# Patient Record
Sex: Female | Born: 1945 | Race: White | Hispanic: No | Marital: Married | Smoking: Never smoker
Health system: Southern US, Community
[De-identification: ages and names within clinical notes are randomized; demographics above are authoritative.]

## PROBLEM LIST (undated history)

## (undated) DIAGNOSIS — G709 Myoneural disorder, unspecified: Secondary | ICD-10-CM

## (undated) DIAGNOSIS — K219 Gastro-esophageal reflux disease without esophagitis: Secondary | ICD-10-CM

## (undated) HISTORY — PX: ABDOMINAL HYSTERECTOMY: SHX81

## (undated) HISTORY — PX: OTHER SURGICAL HISTORY: SHX169

## (undated) HISTORY — PX: TRIGGER FINGER RELEASE: SHX641

## (undated) HISTORY — PX: CHOLECYSTECTOMY: SHX55

---

## 2018-02-27 ENCOUNTER — Other Ambulatory Visit: Payer: Self-pay | Admitting: Orthopaedic Surgery

## 2018-02-27 DIAGNOSIS — M25571 Pain in right ankle and joints of right foot: Secondary | ICD-10-CM

## 2018-02-27 DIAGNOSIS — M25572 Pain in left ankle and joints of left foot: Secondary | ICD-10-CM

## 2018-02-28 ENCOUNTER — Ambulatory Visit
Admission: RE | Admit: 2018-02-28 | Discharge: 2018-02-28 | Disposition: A | Discharge status: Home / Self Care | Payer: Medicare Other | Source: Ambulatory Visit | Attending: Orthopaedic Surgery | Admitting: Orthopaedic Surgery

## 2018-02-28 DIAGNOSIS — M25572 Pain in left ankle and joints of left foot: Secondary | ICD-10-CM

## 2018-02-28 DIAGNOSIS — M25571 Pain in right ankle and joints of right foot: Secondary | ICD-10-CM

## 2018-03-14 ENCOUNTER — Other Ambulatory Visit: Payer: Self-pay | Admitting: Orthopaedic Surgery

## 2018-03-27 ENCOUNTER — Encounter (HOSPITAL_COMMUNITY): Payer: Self-pay

## 2018-03-27 ENCOUNTER — Other Ambulatory Visit: Payer: Self-pay

## 2018-03-27 ENCOUNTER — Encounter (HOSPITAL_COMMUNITY)
Admission: RE | Admit: 2018-03-27 | Discharge: 2018-03-27 | Disposition: A | Discharge status: Home / Self Care | Payer: Medicare Other | Source: Ambulatory Visit | Attending: Orthopaedic Surgery | Admitting: Orthopaedic Surgery

## 2018-03-27 DIAGNOSIS — K219 Gastro-esophageal reflux disease without esophagitis: Secondary | ICD-10-CM | POA: Diagnosis not present

## 2018-03-27 DIAGNOSIS — M722 Plantar fascial fibromatosis: Secondary | ICD-10-CM | POA: Insufficient documentation

## 2018-03-27 DIAGNOSIS — Z79899 Other long term (current) drug therapy: Secondary | ICD-10-CM | POA: Diagnosis not present

## 2018-03-27 DIAGNOSIS — Z791 Long term (current) use of non-steroidal anti-inflammatories (NSAID): Secondary | ICD-10-CM

## 2018-03-27 DIAGNOSIS — M7672 Peroneal tendinitis, left leg: Secondary | ICD-10-CM | POA: Diagnosis not present

## 2018-03-27 DIAGNOSIS — Z7982 Long term (current) use of aspirin: Secondary | ICD-10-CM | POA: Insufficient documentation

## 2018-03-27 DIAGNOSIS — Z79890 Hormone replacement therapy (postmenopausal): Secondary | ICD-10-CM | POA: Diagnosis not present

## 2018-03-27 DIAGNOSIS — Z01812 Encounter for preprocedural laboratory examination: Secondary | ICD-10-CM

## 2018-03-27 DIAGNOSIS — Z9049 Acquired absence of other specified parts of digestive tract: Secondary | ICD-10-CM

## 2018-03-27 DIAGNOSIS — G5751 Tarsal tunnel syndrome, right lower limb: Secondary | ICD-10-CM | POA: Diagnosis not present

## 2018-03-27 HISTORY — DX: Myoneural disorder, unspecified: G70.9

## 2018-03-27 HISTORY — DX: Gastro-esophageal reflux disease without esophagitis: K21.9

## 2018-03-27 LAB — CBC
HCT: 40.5 % (ref 36.0–46.0)
Hemoglobin: 12.9 g/dL (ref 12.0–15.0)
MCH: 29.5 pg (ref 26.0–34.0)
MCHC: 31.9 g/dL (ref 30.0–36.0)
MCV: 92.5 fL (ref 80.0–100.0)
Platelets: 209 10*3/uL (ref 150–400)
RBC: 4.38 MIL/uL (ref 3.87–5.11)
RDW: 14.1 % (ref 11.5–15.5)
WBC: 6 10*3/uL (ref 4.0–10.5)
nRBC: 0 % (ref 0.0–0.2)

## 2018-03-27 LAB — BASIC METABOLIC PANEL
Anion gap: 9 (ref 5–15)
BUN: 20 mg/dL (ref 8–23)
CO2: 23 mmol/L (ref 22–32)
Calcium: 9.1 mg/dL (ref 8.9–10.3)
Chloride: 106 mmol/L (ref 98–111)
Creatinine, Ser: 0.97 mg/dL (ref 0.44–1.00)
GFR calc non Af Amer: 58 mL/min — ABNORMAL LOW (ref >60)
Glucose, Bld: 101 mg/dL — ABNORMAL HIGH (ref 70–99)
Potassium: 4 mmol/L (ref 3.5–5.1)
Sodium: 138 mmol/L (ref 135–145)

## 2018-03-27 NOTE — Anesthesia Preprocedure Evaluation (Addendum)
Anesthesia Evaluation  Patient identified by MRN, date of birth, ID band  Reviewed: Allergy & Precautions, NPO status , Patient's Chart, lab work & pertinent test results  History of Anesthesia Complications Negative for: history of anesthetic complications  Airway Mallampati: II  TM Distance: >3 FB Neck ROM: Full    Dental  (+) Teeth Intact, Dental Advisory Given   Pulmonary neg pulmonary ROS,    Pulmonary exam normal breath sounds clear to auscultation       Cardiovascular negative cardio ROS Normal cardiovascular exam Rhythm:Regular Rate:Normal  Negative stress echo 07/2017   Neuro/Psych negative neurological ROS     GI/Hepatic Neg liver ROS, hiatal hernia, GERD  Medicated and Poorly Controlled,  Endo/Other  negative endocrine ROS  Renal/GU negative Renal ROS     Musculoskeletal Right plantar fascitis   Abdominal   Peds  Hematology negative hematology ROS (+)   Anesthesia Other Findings Day of surgery medications reviewed with the patient.  Reproductive/Obstetrics                           Anesthesia Physical Anesthesia Plan  ASA: II  Anesthesia Plan: General   Post-op Pain Management: GA combined w/ Regional for post-op pain   Induction: Intravenous, Rapid sequence and Cricoid pressure planned  PONV Risk Score and Plan: 3 and Treatment may vary due to age or medical condition and Ondansetron  Airway Management Planned: Oral ETT  Additional Equipment:   Intra-op Plan:   Post-operative Plan: Extubation in OR  Informed Consent: I have reviewed the patients History and Physical, chart, labs and discussed the procedure including the risks, benefits and alternatives for the proposed anesthesia with the patient or authorized representative who has indicated his/her understanding and acceptance.     Dental advisory given  Plan Discussed with: CRNA  Anesthesia Plan Comments: (PAT  note written 03/27/2018 by Shonna Chock, PA-C. )     Anesthesia Quick Evaluation

## 2018-03-27 NOTE — Progress Notes (Addendum)
PCP - Evelena Peat, MD Cardiologist - Dr. Rayfield Citizen   Chest x-ray - pt denies past year, no recent respiratory infections EKG - 05/14/17- Care Everywhere-requested tracing  Stress Test -  07/10/17 in Care Everywhere ECHO -  07/10/17 in Care Everywhere  Cardiac Cath - pt denies  Sleep Study - pt denies CPAP - n./a  Fasting Blood Sugar - n/a Checks Blood Sugar _____ times a day-n./a  Blood Thinner Instructions: n/a Aspirin Instructions: Aspirin 81 mg on hold for 4 weeks   Anesthesia review: Yes-cardiac testing in Care Everywhere, requested EKG tracing  Patient denies shortness of breath, fever, cough and chest pain at PAT appointment  Patient verbalized understanding of instructions that were given to them at the PAT appointment. Patient was also instructed that they will need to review over the PAT instructions again at home before surgery.

## 2018-03-27 NOTE — Pre-Procedure Instructions (Addendum)
Myeisha Akens  03/27/2018      [id number] - [place], [place] - [address] AT [facility] [address] [place] [address] Phone: [phone] Fax: [phone]    Your procedure is scheduled on March 28, 2018.  Report to [facility] Admitting at 0900 AM.  Call this number if you have problems the morning of surgery:  [phone]   Remember:  Do not eat or drink after midnight, tonight 03/27/2018    Take these medicines the morning of surgery with A SIP OF WATER  Dexilant Famotidine (pepcid) Tylenol-if needed  Follow your surgeon's instructions on when to hold/resume aspirin.  If no instructions were given call the office to determine how they would like to you take aspirin  Beginning now, STOP taking any Aleve, Naproxen, Ibuprofen, Motrin, Advil, Goody's, BC's, all herbal medications, fish oil, and all vitamins    Do not wear jewelry, make-up or nail polish.  Do not wear lotions, powders, or perfumes, or deodorant.  Do not shave 48 hours prior to surgery.    Do not bring valuables to the hospital.  [facility] is not responsible for any belongings or valuables.  Contacts, dentures or bridgework may not be worn into surgery.  Leave your suitcase in the car.  After surgery it may be brought to your room.  For patients admitted to the hospital, discharge time will be determined by your treatment team.  Patients discharged the day of surgery will not be allowed to drive home.    [place]- Preparing For Surgery  Before surgery, you can play an important role. Because skin is not sterile, your skin needs to be as free of germs as possible. You can reduce the number of germs on your skin by washing with CHG (chlorahexidine gluconate) Soap before surgery.  CHG is an antiseptic cleaner which kills germs and bonds with the skin to continue killing germs even after washing.    Oral Hygiene is also important to reduce  your risk of infection.  Remember - BRUSH YOUR TEETH THE MORNING OF SURGERY WITH YOUR REGULAR TOOTHPASTE  Please do not use if you have an allergy to CHG or antibacterial soaps. If your skin becomes reddened/irritated stop using the CHG.  Do not shave (including legs and underarms) for at least 48 hours prior to first CHG shower. It is OK to shave your face.  Please follow these instructions carefully.   1. Shower the NIGHT BEFORE SURGERY and the MORNING OF SURGERY with CHG.   2. If you chose to wash your hair, wash your hair first as usual with your normal shampoo.  3. After you shampoo, rinse your hair and body thoroughly to remove the shampoo.  4. Use CHG as you would any other liquid soap. You can apply CHG directly to the skin and wash gently with a scrungie or a clean washcloth.   5. Apply the CHG Soap to your body ONLY FROM THE NECK DOWN.  Do not use on open wounds or open sores. Avoid contact with your eyes, ears, mouth and genitals (private parts). Wash Face and genitals (private parts)  with your normal soap.  6. Wash thoroughly, paying special attention to the area where your surgery will be performed.  7. Thoroughly rinse your body with warm water from the neck down.  8. DO NOT shower/wash with your normal soap after using and rinsing off the CHG  Soap.  9. Pat yourself dry with a CLEAN TOWEL.  10. Wear CLEAN PAJAMAS to bed the night before surgery, wear comfortable clothes the morning of surgery  11. Place CLEAN SHEETS on your bed the night of your first shower and DO NOT SLEEP WITH PETS.    Day of Surgery:  Do not apply any deodorants/lotions.  Please wear clean clothes to the hospital/surgery center.   Remember to brush your teeth WITH YOUR REGULAR TOOTHPASTE.   Please read over the following fact sheets that you were given.

## 2018-03-27 NOTE — Progress Notes (Signed)
Anesthesia Chart Review:  Case:  [id number] Date/Time:  03/28/18 1046   Procedure:  RIGHT PLANTAR FASCIA RELEASE AND TARSAL TUNNEL RELEASE (Right )   Anesthesia type:  Choice   Pre-op diagnosis:  RIGHT FOOT CHRONIC PLANTAR FASCIITIS   Location:  [facility] ROOM 06 / [facility] OR   Surgeon:  Terance Hart, MD      DISCUSSION: Patient is a 73 year old female scheduled for the above procedure.  History includes never smoker, GERD, hiatal hernia, HLD. She had elevated LFTs 09/06/17 with normal abdominal US, s/p ERCP 09/13/08 with repeat LFTs 12/11/17 WNL. Negative stress echo 07/2017.   If no acute changes then I anticipate that she can proceed as planned.   VS: BP 128/64   Pulse 77   Temp (!) 36.3 C   Resp 20   Ht 5' (1.524 m)   Wt 68.6 kg   SpO2 97%   BMI 29.53 kg/m   PROVIDERS: Yolanda Manges, DO is PCP [facility], [place]; see Care Everywhere) - She was seen by cardiologist Holley Raring, MD on 05/14/17 for atypical chest pain, likely GERD or hiatal hernia related. EKG unremarkable. Exercise stress echo was non-ischemic in 07/2017, so PRN follow-up recommended.     LABS: Labs reviewed: Acceptable for surgery. (all labs ordered are listed, but only abnormal results are displayed)  Labs Reviewed  BASIC METABOLIC PANEL - Abnormal; Notable for the following components:      Result Value   Glucose, Bld 101 (*)    GFR calc non Af Amer 58 (*)    All other components within normal limits  CBC    EKG: 05/14/17 [facility]): Tracing requested: Per Result Narrative: Ventricular Rate          75    BPM          Atrial Rate            75    BPM          P-R Interval            178    ms          QRS Duration            80    ms          Q-T Interval            376    ms          QTC                419    ms          P Axis                64    degrees        R Axis               71    degrees        T Axis               43    degrees        Sinus rhythm normal Confirmed by CHEEK, H. BARRETT ([id number]) on 05/14/2017 12:43:03 PM    CV: Exercise stress echo 07/10/17 [facility] Care Everywhere): Summary Functional capacity is fair for age/sex - 7 mets on the 2-min Bruce protocol Normal resting biventricular function (ejection fraction), with no resting segmental abnormality. No clinical or echocardiographic ischemia (induced wall motion abnormality): Negative  stress echocardiogram  Past Medical History:  Diagnosis Date  . GERD (gastroesophageal reflux disease)   . Neuromuscular disorder [facility])     Past Surgical History:  Procedure Laterality Date  . ABDOMINAL HYSTERECTOMY    . CHOLECYSTECTOMY    . cyst excision     left foot 2018  . TRIGGER FINGER RELEASE Right     MEDICATIONS: . acetaminophen (TYLENOL) 500 MG tablet  . Ascorbic Acid (VITAMIN C) 1000 MG tablet  . aspirin EC 81 MG tablet  . BIOTIN PO  . DEXILANT 60 MG capsule  . estradiol (CLIMARA - DOSED IN MG/24 HR) 0.075 mg/24hr patch  . famotidine (PEPCID) 40 MG tablet  . ibuprofen (ADVIL,MOTRIN) 200 MG tablet  . Probiotic Product (PROBIOTIC DAILY PO)   No current facility-administered medications for this encounter.   She hasn't taken ASA in 4 weeks.   Shonna Chock, PA-C Surgical Short Stay/Anesthesiology [facility] Phone [phone] [facility] Phone [phone] 03/27/2018 4:14 PM

## 2018-03-28 ENCOUNTER — Ambulatory Visit (HOSPITAL_COMMUNITY)
Admission: RE | Admit: 2018-03-28 | Discharge: 2018-03-28 | Disposition: A | Discharge status: Home / Self Care | Payer: Medicare Other | Attending: Orthopaedic Surgery | Admitting: Orthopaedic Surgery

## 2018-03-28 ENCOUNTER — Encounter (HOSPITAL_COMMUNITY): Payer: Self-pay | Admitting: General Practice

## 2018-03-28 ENCOUNTER — Encounter (HOSPITAL_COMMUNITY)
Admission: RE | Disposition: A | Discharge status: Home / Self Care | Payer: Self-pay | Source: Home / Self Care | Attending: Orthopaedic Surgery

## 2018-03-28 ENCOUNTER — Ambulatory Visit (HOSPITAL_COMMUNITY): Payer: Medicare Other | Admitting: Anesthesiology

## 2018-03-28 ENCOUNTER — Ambulatory Visit (HOSPITAL_COMMUNITY): Payer: Medicare Other | Admitting: Vascular Surgery

## 2018-03-28 DIAGNOSIS — M722 Plantar fascial fibromatosis: Secondary | ICD-10-CM | POA: Insufficient documentation

## 2018-03-28 DIAGNOSIS — K219 Gastro-esophageal reflux disease without esophagitis: Secondary | ICD-10-CM | POA: Insufficient documentation

## 2018-03-28 DIAGNOSIS — Z79899 Other long term (current) drug therapy: Secondary | ICD-10-CM | POA: Insufficient documentation

## 2018-03-28 DIAGNOSIS — M7672 Peroneal tendinitis, left leg: Secondary | ICD-10-CM | POA: Diagnosis not present

## 2018-03-28 DIAGNOSIS — G5751 Tarsal tunnel syndrome, right lower limb: Secondary | ICD-10-CM | POA: Diagnosis not present

## 2018-03-28 DIAGNOSIS — Z79890 Hormone replacement therapy (postmenopausal): Secondary | ICD-10-CM | POA: Insufficient documentation

## 2018-03-28 DIAGNOSIS — Z7982 Long term (current) use of aspirin: Secondary | ICD-10-CM | POA: Insufficient documentation

## 2018-03-28 HISTORY — PX: PLANTAR FASCIA RELEASE: SHX2239

## 2018-03-28 HISTORY — PX: STERIOD INJECTION: SHX5046

## 2018-03-28 SURGERY — RELEASE, FASCIA, PLANTAR
Anesthesia: General | Site: Foot | Laterality: Right

## 2018-03-28 MED ORDER — PHENYLEPHRINE 40 MCG/ML (10ML) SYRINGE FOR IV PUSH (FOR BLOOD PRESSURE SUPPORT)
PREFILLED_SYRINGE | INTRAVENOUS | Status: AC
  Filled 2018-03-28: qty 10

## 2018-03-28 MED ORDER — PROPOFOL 10 MG/ML IV BOLUS
INTRAVENOUS | Status: DC | PRN
  Administered 2018-03-28: 120 mg via INTRAVENOUS

## 2018-03-28 MED ORDER — MIDAZOLAM HCL 2 MG/2ML IJ SOLN
INTRAMUSCULAR | Status: AC
  Administered 2018-03-28: 1 mg
  Filled 2018-03-28: qty 2

## 2018-03-28 MED ORDER — MIDAZOLAM HCL 2 MG/2ML IJ SOLN
INTRAMUSCULAR | Status: AC
  Filled 2018-03-28: qty 2

## 2018-03-28 MED ORDER — PROPOFOL 10 MG/ML IV BOLUS
INTRAVENOUS | Status: AC
  Filled 2018-03-28: qty 20

## 2018-03-28 MED ORDER — MIDAZOLAM HCL 5 MG/5ML IJ SOLN
INTRAMUSCULAR | Status: DC | PRN
  Administered 2018-03-28: 2 mg via INTRAVENOUS

## 2018-03-28 MED ORDER — PROMETHAZINE HCL 6.25 MG/5ML PO SYRP: 6.2500 mg | ORAL_SOLUTION | Freq: Four times a day (QID) | ORAL | 1 refills | Status: AC | PRN

## 2018-03-28 MED ORDER — ONDANSETRON HCL 4 MG/2ML IJ SOLN
INTRAMUSCULAR | Status: DC | PRN
  Administered 2018-03-28: 4 mg via INTRAVENOUS

## 2018-03-28 MED ORDER — 0.9 % SODIUM CHLORIDE (POUR BTL) OPTIME
TOPICAL | Status: DC | PRN
  Administered 2018-03-28: 1000 mL

## 2018-03-28 MED ORDER — CHLORHEXIDINE GLUCONATE 4 % EX LIQD: 60.0000 mL | Freq: Once | CUTANEOUS | Status: DC

## 2018-03-28 MED ORDER — BUPIVACAINE-EPINEPHRINE (PF) 0.5% -1:200000 IJ SOLN
INTRAMUSCULAR | Status: DC | PRN
  Administered 2018-03-28: 15 mL via PERINEURAL
  Administered 2018-03-28: 10 mL via PERINEURAL

## 2018-03-28 MED ORDER — FENTANYL CITRATE (PF) 100 MCG/2ML IJ SOLN
25.0000 ug | INTRAMUSCULAR | Status: DC | PRN
  Administered 2018-03-28 (×2): 50 ug via INTRAVENOUS
  Administered 2018-03-28 (×2): 25 ug via INTRAVENOUS

## 2018-03-28 MED ORDER — MORPHINE SULFATE 15 MG PO TABS: 15.0000 mg | ORAL_TABLET | Freq: Four times a day (QID) | ORAL | 0 refills | Status: AC | PRN

## 2018-03-28 MED ORDER — FENTANYL CITRATE (PF) 250 MCG/5ML IJ SOLN
INTRAMUSCULAR | Status: AC
  Filled 2018-03-28: qty 5

## 2018-03-28 MED ORDER — SUCCINYLCHOLINE CHLORIDE 200 MG/10ML IV SOSY
PREFILLED_SYRINGE | INTRAVENOUS | Status: DC | PRN
  Administered 2018-03-28: 80 mg via INTRAVENOUS

## 2018-03-28 MED ORDER — FENTANYL CITRATE (PF) 100 MCG/2ML IJ SOLN
INTRAMUSCULAR | Status: AC
  Administered 2018-03-28: 50 ug
  Filled 2018-03-28: qty 2

## 2018-03-28 MED ORDER — LACTATED RINGERS IV SOLN: INTRAVENOUS | Status: DC

## 2018-03-28 MED ORDER — ACETAMINOPHEN 500 MG PO TABS
1000.0000 mg | ORAL_TABLET | Freq: Once | ORAL | Status: AC
  Administered 2018-03-28: 1000 mg via ORAL

## 2018-03-28 MED ORDER — METHYLPREDNISOLONE ACETATE 80 MG/ML IJ SUSP
INTRAMUSCULAR | Status: DC | PRN
  Administered 2018-03-28: 80 mg via INTRALESIONAL

## 2018-03-28 MED ORDER — FENTANYL CITRATE (PF) 100 MCG/2ML IJ SOLN
INTRAMUSCULAR | Status: AC
  Administered 2018-03-28: 25 ug via INTRAVENOUS
  Filled 2018-03-28: qty 2

## 2018-03-28 MED ORDER — ONDANSETRON HCL 4 MG/2ML IJ SOLN
INTRAMUSCULAR | Status: AC
  Filled 2018-03-28: qty 2

## 2018-03-28 MED ORDER — LIDOCAINE 2% (20 MG/ML) 5 ML SYRINGE
INTRAMUSCULAR | Status: AC
  Filled 2018-03-28: qty 5

## 2018-03-28 MED ORDER — EPHEDRINE SULFATE-NACL 50-0.9 MG/10ML-% IV SOSY
PREFILLED_SYRINGE | INTRAVENOUS | Status: DC | PRN
  Administered 2018-03-28 (×2): 5 mg via INTRAVENOUS

## 2018-03-28 MED ORDER — LIDOCAINE 2% (20 MG/ML) 5 ML SYRINGE
INTRAMUSCULAR | Status: DC | PRN
  Administered 2018-03-28: 40 mg via INTRAVENOUS

## 2018-03-28 MED ORDER — ROCURONIUM BROMIDE 50 MG/5ML IV SOSY
PREFILLED_SYRINGE | INTRAVENOUS | Status: AC
  Filled 2018-03-28: qty 5

## 2018-03-28 MED ORDER — PROMETHAZINE HCL 25 MG/ML IJ SOLN: 6.2500 mg | INTRAMUSCULAR | Status: DC | PRN

## 2018-03-28 MED ORDER — ACETAMINOPHEN 10 MG/ML IV SOLN: 1000.0000 mg | Freq: Once | INTRAVENOUS | Status: DC | PRN

## 2018-03-28 MED ORDER — FENTANYL CITRATE (PF) 100 MCG/2ML IJ SOLN
INTRAMUSCULAR | Status: DC | PRN
  Administered 2018-03-28: 75 ug via INTRAVENOUS
  Administered 2018-03-28: 25 ug via INTRAVENOUS
  Administered 2018-03-28: 50 ug via INTRAVENOUS

## 2018-03-28 MED ORDER — EPHEDRINE 5 MG/ML INJ
INTRAVENOUS | Status: AC
  Filled 2018-03-28: qty 10

## 2018-03-28 MED ORDER — ACETAMINOPHEN 500 MG PO TABS
ORAL_TABLET | ORAL | Status: AC
  Administered 2018-03-28: 1000 mg via ORAL
  Filled 2018-03-28: qty 2

## 2018-03-28 MED ORDER — BUPIVACAINE HCL 0.5 % IJ SOLN
INTRAMUSCULAR | Status: DC | PRN
  Administered 2018-03-28: 10 mL

## 2018-03-28 MED ORDER — METHYLPREDNISOLONE ACETATE 80 MG/ML IJ SUSP
INTRAMUSCULAR | Status: AC
  Filled 2018-03-28: qty 1

## 2018-03-28 MED ORDER — CEFAZOLIN SODIUM-DEXTROSE 2-4 GM/100ML-% IV SOLN
2.0000 g | INTRAVENOUS | Status: AC
  Administered 2018-03-28: 2 g via INTRAVENOUS

## 2018-03-28 MED ORDER — BUPIVACAINE HCL (PF) 0.5 % IJ SOLN
INTRAMUSCULAR | Status: AC
  Filled 2018-03-28: qty 10

## 2018-03-28 MED ORDER — FENTANYL CITRATE (PF) 100 MCG/2ML IJ SOLN
INTRAMUSCULAR | Status: AC
  Administered 2018-03-28: 50 ug via INTRAVENOUS
  Filled 2018-03-28: qty 2

## 2018-03-28 MED ORDER — LACTATED RINGERS IV SOLN
INTRAVENOUS | Status: DC | PRN
  Administered 2018-03-28: 10:00:00 via INTRAVENOUS

## 2018-03-28 MED ORDER — CEFAZOLIN SODIUM-DEXTROSE 2-4 GM/100ML-% IV SOLN
INTRAVENOUS | Status: AC
  Filled 2018-03-28: qty 100

## 2018-03-28 SURGICAL SUPPLY — 50 items
BANDAGE ACE 4X5 VEL STRL LF (GAUZE/BANDAGES/DRESSINGS) ×4 IMPLANT
BANDAGE ACE 6X5 VEL STRL LF (GAUZE/BANDAGES/DRESSINGS) ×4 IMPLANT
BANDAGE ESMARK 6X9 LF (GAUZE/BANDAGES/DRESSINGS) IMPLANT
BNDG COHESIVE 4X5 TAN STRL (GAUZE/BANDAGES/DRESSINGS) IMPLANT
BNDG ELASTIC 6X10 VLCR STRL LF (GAUZE/BANDAGES/DRESSINGS) ×4 IMPLANT
BNDG ESMARK 4X9 LF (GAUZE/BANDAGES/DRESSINGS) IMPLANT
BNDG ESMARK 6X9 LF (GAUZE/BANDAGES/DRESSINGS)
BNDG GAUZE ELAST 4 BULKY (GAUZE/BANDAGES/DRESSINGS) ×4 IMPLANT
COVER SURGICAL LIGHT HANDLE (MISCELLANEOUS) ×8 IMPLANT
COVER WAND RF STERILE (DRAPES) IMPLANT
CUFF TOURNIQUET SINGLE 34IN LL (TOURNIQUET CUFF) ×4 IMPLANT
DRAPE U-SHAPE 47X51 STRL (DRAPES) ×4 IMPLANT
DRSG PAD ABDOMINAL 8X10 ST (GAUZE/BANDAGES/DRESSINGS) IMPLANT
DRSG XEROFORM 1X8 (GAUZE/BANDAGES/DRESSINGS) IMPLANT
DURAPREP 26ML APPLICATOR (WOUND CARE) ×4 IMPLANT
ELECT REM PT RETURN 9FT ADLT (ELECTROSURGICAL) ×4
ELECTRODE REM PT RTRN 9FT ADLT (ELECTROSURGICAL) ×2 IMPLANT
GAUZE SPONGE 4X4 12PLY STRL (GAUZE/BANDAGES/DRESSINGS) ×4 IMPLANT
GAUZE XEROFORM 1X8 LF (GAUZE/BANDAGES/DRESSINGS) ×4 IMPLANT
GLOVE BIOGEL M STRL SZ7.5 (GLOVE) ×4 IMPLANT
GLOVE INDICATOR 8.0 STRL GRN (GLOVE) ×4 IMPLANT
GOWN STRL REUS W/ TWL LRG LVL3 (GOWN DISPOSABLE) ×2 IMPLANT
GOWN STRL REUS W/TWL LRG LVL3 (GOWN DISPOSABLE) ×2
GOWN STRL REUS W/TWL XL LVL3 (GOWN DISPOSABLE) ×4 IMPLANT
HANDPIECE INTERPULSE COAX TIP (DISPOSABLE) ×2
KIT BASIN OR (CUSTOM PROCEDURE TRAY) ×4 IMPLANT
KIT TURNOVER KIT B (KITS) ×4 IMPLANT
MANIFOLD NEPTUNE II (INSTRUMENTS) ×4 IMPLANT
NEEDLE 22X1 1/2 (OR ONLY) (NEEDLE) IMPLANT
NS IRRIG 1000ML POUR BTL (IV SOLUTION) ×4 IMPLANT
PACK ORTHO EXTREMITY (CUSTOM PROCEDURE TRAY) ×4 IMPLANT
PAD ARMBOARD 7.5X6 YLW CONV (MISCELLANEOUS) ×8 IMPLANT
PAD CAST 4YDX4 CTTN HI CHSV (CAST SUPPLIES) ×2 IMPLANT
PADDING CAST COTTON 4X4 STRL (CAST SUPPLIES) ×2
PADDING CAST SYN 6 (CAST SUPPLIES) ×2
PADDING CAST SYNTHETIC 4 (CAST SUPPLIES) ×2
PADDING CAST SYNTHETIC 4X4 STR (CAST SUPPLIES) ×2 IMPLANT
PADDING CAST SYNTHETIC 6X4 NS (CAST SUPPLIES) ×2 IMPLANT
SET HNDPC FAN SPRY TIP SCT (DISPOSABLE) ×2 IMPLANT
SPLINT PLASTER CAST XFAST 5X30 (CAST SUPPLIES) ×2 IMPLANT
SPLINT PLASTER XFAST SET 5X30 (CAST SUPPLIES) ×2
SPONGE LAP 18X18 RF (DISPOSABLE) IMPLANT
STOCKINETTE IMPERVIOUS 9X36 MD (GAUZE/BANDAGES/DRESSINGS) IMPLANT
SUT ETHILON 2 0 FS 18 (SUTURE) IMPLANT
SUT MNCRL AB 3-0 PS2 18 (SUTURE) ×8 IMPLANT
TUBE CONNECTING 12'X1/4 (SUCTIONS) ×1
TUBE CONNECTING 12X1/4 (SUCTIONS) ×3 IMPLANT
TUBING TUR DISP (UROLOGICAL SUPPLIES) IMPLANT
UNDERPAD 30X30 (UNDERPADS AND DIAPERS) ×4 IMPLANT
YANKAUER SUCT BULB TIP NO VENT (SUCTIONS) ×4 IMPLANT

## 2018-03-28 NOTE — Discharge Instructions (Signed)
DR. Grazia Taffe FOOT & ANKLE SURGERY POST-OP INSTRUCTIONS ° ° °Pain Management °1. The numbing medicine and your leg will last around 8 hours, take a dose of your pain medicine as soon as you feel it wearing off to avoid rebound pain. °2. Keep your foot elevated above heart level.  Make sure that your heel hangs free ('floats'). °3. Take all prescribed medication as directed. °4. If taking narcotic pain medication you may want to use an over-the-counter stool softener to avoid constipation. °5. You may take over-the-counter NSAIDs (ibuprofen, naproxen, etc.) as well as over-the-counter acetaminophen as directed on the packaging as a supplement for your pain and may also use it to wean away from the prescription medication. ° °Activity °? Non-weightbearing °? Postoperatively, you will be placed into a splint which stays on for 2 weeks and then will be changed at your first postop visit. ° °First Postoperative Visit °1. Your first postop visit will be at least 2 weeks after surgery.  This should be scheduled when you schedule surgery. °2. If you do not have a postoperative visit scheduled please call [phone] to schedule an appointment. °3. At the appointment your incision will be evaluated for suture removal, x-rays will be obtained if necessary. ° °General Instructions °1. Swelling is very common after foot and ankle surgery.  It often takes 3 months for the foot and ankle to begin to feel comfortable.  Some amount of swelling will persist for 6-12 months. °2. DO NOT change the dressing.  If there is a problem with the dressing (too tight, loose, gets wet, etc.) please contact Dr. Cherilynn Schomburg's office. °3. DO NOT get the dressing wet.  For showers you can use an over-the-counter cast cover or wrap a washcloth around the top of your dressing and then cover it with a plastic bag and tape it to your leg. °4. DO NOT soak the incision (no tubs, pools, bath, etc.) until you have approval from Dr. Rhonna Holster. ° °Contact Dr. Adairs  office or go to Emergency Room if: °1. Temperature above 101° F. °2. Increasing pain that is unresponsive to pain medication or elevation °3. Excessive redness or swelling in your foot °4. Dressing problems - excessive bloody drainage, looseness or tightness, or if dressing gets wet °5. Develop pain, swelling, warmth, or discoloration of your calf ° °

## 2018-03-28 NOTE — Transfer of Care (Signed)
Immediate Anesthesia Transfer of Care Note  Patient: Marie Donaldson  Procedure(s) Performed: RIGHT PLANTAR FASCIA RELEASE AND TARSAL TUNNEL RELEASE (Right Foot) Steroid Injection (Left Foot)  Patient Location: PACU  Anesthesia Type:General  Level of Consciousness: awake, alert  and oriented  Airway & Oxygen Therapy: Patient connected to face mask oxygen  Post-op Assessment: Post -op Vital signs reviewed and stable  Post vital signs: stable  Last Vitals:  Vitals Value Taken Time  BP 143/101 03/28/2018 11:32 AM  Temp    Pulse 99 03/28/2018 11:34 AM  Resp 11 03/28/2018 11:34 AM  SpO2 100 % 03/28/2018 11:34 AM  Vitals shown include unvalidated device data.  Last Pain:  Vitals:   03/28/18 0937  TempSrc:   PainSc: 8       Patients Stated Pain Goal: 3 (03/28/18 0109)  Complications: No apparent anesthesia complications

## 2018-03-28 NOTE — Anesthesia Postprocedure Evaluation (Signed)
Anesthesia Post Note  Patient: Federico Flake  Procedure(s) Performed: RIGHT PLANTAR FASCIA RELEASE AND TARSAL TUNNEL RELEASE (Right Foot) Steroid Injection (Left Foot)     Patient location during evaluation: PACU Anesthesia Type: General Level of consciousness: awake and alert Pain management: pain level controlled Vital Signs Assessment: post-procedure vital signs reviewed and stable Respiratory status: spontaneous breathing, nonlabored ventilation and respiratory function stable Cardiovascular status: blood pressure returned to baseline and stable Postop Assessment: no apparent nausea or vomiting Anesthetic complications: no    Last Vitals:  Vitals:   03/28/18 1415 03/28/18 1430  BP: 122/83 126/82  Pulse: 76 83  Resp: 14 14  Temp: 36.5 C 36.5 C  SpO2: 97% 95%    Last Pain:  Vitals:   03/28/18 1415  TempSrc:   PainSc: Asleep                 Kaylyn Layer

## 2018-03-28 NOTE — H&P (Signed)
Marie Donaldson is an 73 y.o. female.   Chief Complaint: Right chronic plantar fasciitis, left peroneal tendinitis HPI: Marie Donaldson is here today for surgery.  She had a plantar fascial release 10 years ago in her left which provided relief of her chronic plantar fasciitis symptoms.  She has requested this on the right.  Given her failure of conservative treatment form of boot immobilization, physical therapy and other pain relief modalities she is indicated for plantar fascial release and tarsal tunnel release.  Today she complains of some left posterior lateral ankle pain is requesting a corticosteroid injection into her peroneal tendon sheath.  She denies any recent fevers or chills.  Denies any recent illnesses.  Past Medical History:  Diagnosis Date  . GERD (gastroesophageal reflux disease)   . Neuromuscular disorder [facility])     Past Surgical History:  Procedure Laterality Date  . ABDOMINAL HYSTERECTOMY    . CHOLECYSTECTOMY    . cyst excision     left foot 2018  . TRIGGER FINGER RELEASE Right     History reviewed. No pertinent family history. Social History:  reports that she has never smoked. She has never used smokeless tobacco. She reports that she does not use drugs. No history on file for alcohol.  Allergies:  Allergies  Allergen Reactions  . Codeine Hives and Rash       . Hydrocodone Hives  . Oxycodone Hives  . Prednisone Hives and Rash  . Propoxyphene Hives and Rash  . Neosporin [Neomycin-Bacitracin Zn-Polymyx]   . Omeprazole Other (See Comments)    Migraine   . Tramadol Other (See Comments)    migraines     Medications Prior to Admission  Medication Sig Dispense Refill  . acetaminophen (TYLENOL) 500 MG tablet Take 1,500 mg by mouth every 8 (eight) hours as needed for moderate pain.    . Ascorbic Acid (VITAMIN C) 1000 MG tablet Take 1,000 mg by mouth daily.    Marland Kitchen aspirin EC 81 MG tablet Take 81 mg by mouth daily.    Marland Kitchen BIOTIN PO Take 15,000 mg by mouth daily.     Marland Kitchen  DEXILANT 60 MG capsule Take 60 mg by mouth 2 (two) times daily.    Marland Kitchen estradiol (CLIMARA - DOSED IN MG/24 HR) 0.075 mg/24hr patch Place 0.075 mg onto the skin once a week.    . famotidine (PEPCID) 40 MG tablet Take 40 mg by mouth 2 (two) times daily.    Marland Kitchen ibuprofen (ADVIL,MOTRIN) 200 MG tablet Take 400 mg by mouth every 6 (six) hours as needed for moderate pain.    . Probiotic Product (PROBIOTIC DAILY PO) Take 1 capsule by mouth daily.      Results for orders placed or performed during the hospital encounter of 03/27/18 (from the past 48 hour(s))  Basic metabolic panel     Status: Abnormal   Collection Time: 03/27/18  3:54 PM  Result Value Ref Range   Sodium 138 135 - 145 mmol/L   Potassium 4.0 3.5 - 5.1 mmol/L   Chloride 106 98 - 111 mmol/L   CO2 23 22 - 32 mmol/L   Glucose, Bld 101 (H) 70 - 99 mg/dL   BUN 20 8 - 23 mg/dL   Creatinine, Ser 3.42 0.44 - 1.00 mg/dL   Calcium 9.1 8.9 - 87.6 mg/dL   GFR calc non Af Amer 58 (L) >60 mL/min   GFR calc Af Amer >60 >60 mL/min   Anion gap 9 5 - 15  Comment: Performed at [facility], [address], [place], [place] [address]  CBC     Status: None   Collection Time: 03/27/18  3:54 PM  Result Value Ref Range   WBC 6.0 4.0 - 10.5 K/uL   RBC 4.38 3.87 - 5.11 MIL/uL   Hemoglobin 12.9 12.0 - 15.0 g/dL   HCT 03.5 00.9 - 38.1 %   MCV 92.5 80.0 - 100.0 fL   MCH 29.5 26.0 - 34.0 pg   MCHC 31.9 30.0 - 36.0 g/dL   RDW 82.9 93.7 - 16.9 %   Platelets 209 150 - 400 K/uL   nRBC 0.0 0.0 - 0.2 %    Comment: Performed at [facility], [address], [place], [place] [address]   No results found.  Review of Systems  Constitutional: Negative.   HENT: Negative.   Eyes: Negative.   Respiratory: Negative.   Cardiovascular: Negative.   Gastrointestinal: Negative.   Musculoskeletal:       Right plantar heel pain.  Left posterior lateral ankle pain.  Skin: Negative.   Neurological: Negative.   Psychiatric/Behavioral: Negative.      Blood pressure (!) 165/85, pulse 78, temperature 97.7 F (36.5 C), temperature source Oral, resp. rate 20, height 5' (1.524 m), weight 68.6 kg, SpO2 100 %. Physical Exam  Constitutional: She appears well-developed.  HENT:  Head: Normocephalic.  Eyes: Conjunctivae are normal.  Neck: Neck supple.  Cardiovascular: Normal rate.  Respiratory: Effort normal.  Musculoskeletal:     Comments: Tenderness to palpation of the plantar fascial origin on the right.  Slight cavus posture.  10 degrees ankle dorsiflexion passively.  On the left she has tenderness at the distal aspect of the peroneus brevis tendon.  Mild swelling.  Good eversion strength.  Motor sensation intact distally bilateral lower extremities.  Feet are warm and well-perfused.  Neurological: She is alert.  Skin: Skin is warm.  Psychiatric: She has a normal mood and affect.     Assessment/Plan We will proceed with planned intervention.  Will be in the form of right plantar fascial release and distal tarsal tunnel release.  We will also perform an intra-sheath injection in her peroneal tendons at the time of surgery.  The consent was updated and initialed by myself and the patient.  She understands the risk, benefits and alternatives as well as the postoperative weightbearing restrictions.  We had a lengthy discussion about the possibility of continued pain despite plantar fascial release surgery.  She understands this risk and wishes to proceed.  Terance Hart, MD 03/28/2018, 10:12 AM

## 2018-03-28 NOTE — Anesthesia Procedure Notes (Signed)
Anesthesia Regional Block: Adductor canal block   Pre-Anesthetic Checklist: ,, timeout performed, Correct Patient, Correct Site, Correct Laterality, Correct Procedure, Correct Position, site marked, Risks and benefits discussed, pre-op evaluation,  At surgeon's request and post-op pain management  Laterality: Right  Prep: Maximum Sterile Barrier Precautions used, chloraprep       Needles:  Injection technique: Single-shot  Needle Type: Echogenic Stimulator Needle     Needle Length: 9cm  Needle Gauge: 22     Additional Needles:   Procedures:,,,, ultrasound used (permanent image in chart),,,,  Narrative:  Start time: 03/28/2018 9:38 AM End time: 03/28/2018 9:40 AM Injection made incrementally with aspirations every 5 mL.  Performed by: Personally  Anesthesiologist: Kaylyn Layer, MD  Additional Notes: Risks, benefits, and alternative discussed. Patient gave consent for procedure. Patient prepped and draped in sterile fashion. Sedation administered, patient remains easily responsive to voice. Relevant anatomy identified with ultrasound guidance. Local anesthetic given in 5cc increments with no signs or symptoms of intravascular injection. No pain or paraesthesias with injection. Patient monitored throughout procedure with signs of LAST or immediate complications. Tolerated well. Ultrasound image placed in chart.  Marie Greenhouse, MD

## 2018-03-28 NOTE — Op Note (Signed)
Marie Donaldson female 73 y.o. 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 female was dealing with chronic plantar fasciitis.  She had failed conservative treatment in the form boot immobilization, anti-inflammatories, injection, physical therapy.  She had had a prior plantar fascial release 10 years ago in her left foot and did well from this.  She was requesting plantar fascial release.  She also had some shooting type pains in the medial aspect of her foot.  We discussed the risk, benefits and alternatives of surgery which include a inadequate pain relief, infection, wound healing complications, continued pain, arch collapse.  After weighing these risks he wished to proceed.  She also understood the perioperative and anesthetic risk which included death.  PROCEDURE: Patient was identified in the preoperative holding area.  The right leg was marked by myself.  The left leg was also marked given that she wanted an injection.  The right lower extremity peripheral nerve block was performed by anesthesia.  The consent was signed by myself and the patient.  She was taken the operative suite placed supine operative table.  The preoperative antibiotics were given.  Anesthesia was induced without difficulty.  The right lower extremity was prepped and draped in usual sterile fashion.  We began by elevating the tourniquet to 250 mmHg.  All bony prominences were well-padded.  We began by making a 4 cm longitudinal incision posterior to the medial  malleolus down in line with the neurovascular bundle to the origin of the plantar fascia.  This taken sharply down through skin subcutaneous tissue.  Then blunt dissection was used to identify the fascia overlying the abductor pollicis muscle.  This was incised.  The muscle belly was retracted and the plantar fascia was identified.  Superficial to the plantar fascia was mobilized as well as deep to the plantar fascia.  We are able to identify the medial band of the plantar fashion this was released with Metzenbaum scissors.  Then the deep fascia of the abductor hallucis muscle was incised performing portion of the distal tarsal tunnel release.  Then the more proximal portion of the incision was bluntly dissected down to the sheath of the posterior tibial tendon.  Posterior to this then an incision was made and underlying this was noted that there was a varicosity present.  This was mobilized and the portion of the flexor retinaculum was released performing the distal tarsal tunnel release.  There was no band of tissue noted proximally or distally along that portion of the neurovascular bundle.  Then the wound was irrigated.  The remaining portion of the plantar fascia was palpated and intact.  Then the deep tissue was closed with 3-0 Monocryl and the skin with 3-0 nylon.  A soft dressing of Xeroform, 4 x 4's and she cotton were placed.  She was placed in a short leg splint.  She tolerated this portion of the procedure well.  The drapes were removed.  The left lower extremity was then prepped overlying the area of maximal tenderness along the peroneal tendons.  Then 1 cc of Depo-Medrol and 2 cc of half percent Marcaine were injected into the tendon  sheath and around the tendon without difficulty.  A Band-Aid was placed.  She was awakened from anesthesia and moved to the hospital bed and taken to PACU in stable condition.  There were no complications.  All counts were correct at the end the case.  POST OPERATIVE  INSTRUCTIONS: Remain nonweightbearing to the right lower extremity. Call the office with concerns Keep splint dry and elevate limb Follow-up in 2 weeks for splint removal, suture removal and placement of either a walking boot or postoperative shoe based on symptoms.  No need for DVT prophylaxis as she takes aspirin at baseline.  TOURNIQUET TIME: Unknown  BLOOD LOSS:  Minimal         DRAINS: none         SPECIMEN: none       COMPLICATIONS:  * No complications entered in OR log *         Disposition: PACU - hemodynamically stable.         Condition: stable

## 2018-03-28 NOTE — Anesthesia Procedure Notes (Addendum)
Procedure Name: Intubation Date/Time: 03/28/2018 10:32 AM Performed by: Julian Reil, CRNA Pre-anesthesia Checklist: Patient identified, Emergency Drugs available, Suction available and Patient being monitored Patient Re-evaluated:Patient Re-evaluated prior to induction Oxygen Delivery Method: Circle system utilized Preoxygenation: Pre-oxygenation with 100% oxygen Induction Type: IV induction, Rapid sequence and Cricoid Pressure applied Laryngoscope Size: Miller and 3 Grade View: Grade III Tube type: Oral Tube size: 7.0 mm Number of attempts: 1 Airway Equipment and Method: Stylet Placement Confirmation: ETT inserted through vocal cords under direct vision,  positive ETCO2 and breath sounds checked- equal and bilateral Secured at: 22 cm Tube secured with: Tape Dental Injury: Teeth and Oropharynx as per pre-operative assessment  Comments: Noted slightly "roughed" edge to right upper front tooth after DL.  4x4s bite block used.

## 2018-03-28 NOTE — Anesthesia Procedure Notes (Signed)
Anesthesia Regional Block: Popliteal block   Pre-Anesthetic Checklist: ,, timeout performed, Correct Patient, Correct Site, Correct Laterality, Correct Procedure, Correct Position, site marked, Risks and benefits discussed, pre-op evaluation,  At surgeon's request and post-op pain management  Laterality: Right  Prep: Maximum Sterile Barrier Precautions used, chloraprep       Needles:  Injection technique: Single-shot  Needle Type: Echogenic Stimulator Needle     Needle Length: 9cm  Needle Gauge: 22     Additional Needles:   Procedures:,,,, ultrasound used (permanent image in chart),,,,  Narrative:  Start time: 03/28/2018 9:42 AM End time: 03/28/2018 9:44 AM Injection made incrementally with aspirations every 5 mL.  Performed by: Personally  Anesthesiologist: Kaylyn Layer, MD  Additional Notes: Risks, benefits, and alternative discussed. Patient gave consent for procedure. Patient prepped and draped in sterile fashion. Sedation administered, patient remains easily responsive to voice. Relevant anatomy identified with ultrasound guidance. Local anesthetic given in 5cc increments with no signs or symptoms of intravascular injection. No pain or paraesthesias with injection. Patient monitored throughout procedure with signs of LAST or immediate complications. Tolerated well. Ultrasound image placed in chart.  Amalia Greenhouse, MD

## 2018-03-28 NOTE — Progress Notes (Signed)
Pt refused crutches, stated that she has crutches at home. Pt is aware that she is not to put any weight on right extremity. Pain level is tolerable, denies n/v. Went home with husband. No questions regarding discharge teaching.

## 2018-03-29 ENCOUNTER — Encounter (HOSPITAL_COMMUNITY): Payer: Self-pay | Admitting: Orthopaedic Surgery

## 2020-01-22 ENCOUNTER — Telehealth: Payer: Self-pay | Admitting: Oncology

## 2020-01-22 ENCOUNTER — Other Ambulatory Visit (HOSPITAL_COMMUNITY): Payer: Self-pay | Admitting: Physician Assistant

## 2020-01-22 NOTE — Progress Notes (Signed)
I connected by phone with Marie Donaldson on 01/22/2020 at 4:14 PM to discuss the potential use of a new treatment for mild to moderate COVID-19 viral infection in non-hospitalized patients.  This patient is a 74 y.o. female that meets the FDA criteria for Emergency Use Authorization of COVID monoclonal antibody casirivimab/imdevimab, bamlanivimab/etesevimab, or sotrovimab.  Has a (+) direct SARS-CoV-2 viral test result  Has mild or moderate COVID-19   Is NOT hospitalized due to COVID-19  Is within 10 days of symptom onset  Has at least one of the high risk factor(s) for progression to severe COVID-19 and/or hospitalization as defined in EUA.  Specific high risk criteria : Older age (>/= 74 yo)   I have spoken and communicated the following to the patient or parent/caregiver regarding COVID monoclonal antibody treatment:  1. FDA has authorized the emergency use for the treatment of mild to moderate COVID-19 in adults and pediatric patients with positive results of direct SARS-CoV-2 viral testing who are 25 years of age and older weighing at least 40 kg, and who are at high risk for progressing to severe COVID-19 and/or hospitalization.  2. The significant known and potential risks and benefits of COVID monoclonal antibody, and the extent to which such potential risks and benefits are unknown.  3. Information on available alternative treatments and the risks and benefits of those alternatives, including clinical trials.  4. Patients treated with COVID monoclonal antibody should continue to self-isolate and use infection control measures (e.g., wear mask, isolate, social distance, avoid sharing personal items, clean and disinfect "high touch" surfaces, and frequent handwashing) according to CDC guidelines.   5. The patient or parent/caregiver has the option to accept or refuse COVID monoclonal antibody treatment.  After reviewing this information with the patient, the patient has agreed  to receive one of the available covid 19 monoclonal antibodies and will be provided an appropriate fact sheet prior to infusion. Jodell Cipro, PA-C 01/22/2020 4:14 PM

## 2020-01-23 ENCOUNTER — Other Ambulatory Visit (HOSPITAL_COMMUNITY): Payer: Self-pay

## 2020-01-23 ENCOUNTER — Ambulatory Visit (HOSPITAL_COMMUNITY)
Admission: RE | Admit: 2020-01-23 | Discharge: 2020-01-23 | Disposition: A | Discharge status: Home / Self Care | Payer: Medicare Other | Source: Ambulatory Visit | Attending: Pulmonary Disease | Admitting: Pulmonary Disease

## 2020-01-23 DIAGNOSIS — U071 COVID-19: Secondary | ICD-10-CM | POA: Insufficient documentation

## 2020-01-23 DIAGNOSIS — Z23 Encounter for immunization: Secondary | ICD-10-CM | POA: Diagnosis not present

## 2020-01-23 MED ORDER — SODIUM CHLORIDE 0.9 % IV SOLN: Freq: Once | INTRAVENOUS | Status: AC

## 2020-01-23 MED ORDER — ALBUTEROL SULFATE HFA 108 (90 BASE) MCG/ACT IN AERS: 2.0000 | INHALATION_SPRAY | Freq: Once | RESPIRATORY_TRACT | Status: DC | PRN

## 2020-01-23 MED ORDER — SODIUM CHLORIDE 0.9 % IV SOLN: INTRAVENOUS | Status: DC | PRN

## 2020-01-23 MED ORDER — EPINEPHRINE 0.3 MG/0.3ML IJ SOAJ: 0.3000 mg | Freq: Once | INTRAMUSCULAR | Status: DC | PRN

## 2020-01-23 MED ORDER — DIPHENHYDRAMINE HCL 50 MG/ML IJ SOLN: 50.0000 mg | Freq: Once | INTRAMUSCULAR | Status: DC | PRN

## 2020-01-23 MED ORDER — FAMOTIDINE IN NACL 20-0.9 MG/50ML-% IV SOLN: 20.0000 mg | Freq: Once | INTRAVENOUS | Status: DC | PRN

## 2020-01-23 NOTE — Progress Notes (Signed)
  Diagnosis: COVID-19  Physician: Dr. Wright   Procedure: [facility] Infusion [facility] Med: bamlanivimab\etesevimab infusion - Provided patient with bamlanimivab\etesevimab fact sheet for patients, parents and caregivers prior to infusion.  Complications: No immediate complications noted.  Discharge: Discharged home   Marie Donaldson  [name] 01/23/2020   

## 2020-01-23 NOTE — Progress Notes (Signed)
Patient reviewed Fact Sheet for Patients, Parents, and Caregivers for Emergency Use Authorization (EUA) of bamlanivimab and etesevimab for the Treatment of Coronavirus. Patient also reviewed and is agreeable to the estimated cost of treatment. Patient is agreeable to proceed.   

## 2020-01-23 NOTE — Discharge Instructions (Signed)
What types of side effects do monoclonal antibody drugs cause?  Common side effects  In general, the more common side effects caused by monoclonal antibody drugs include: . Allergic reactions, such as hives or itching . Flu-like signs and symptoms, including chills, fatigue, fever, and muscle aches and pains . Nausea, vomiting . Diarrhea . Skin rashes . Low blood pressure   The CDC is recommending patients who receive monoclonal antibody treatments wait at least 90 days before being vaccinated.  Currently, there are no data on the safety and efficacy of mRNA COVID-19 vaccines in persons who received monoclonal antibodies or convalescent plasma as part of COVID-19 treatment. Based on the estimated half-life of such therapies as well as evidence suggesting that reinfection is uncommon in the 90 days after initial infection, vaccination should be deferred for at least 90 days, as a precautionary measure until additional information becomes available, to avoid interference of the antibody treatment with vaccine-induced immune responses.  If you have any questions or concerns after the infusion please call the Advanced Practice Provider on call at [phone]. This number is ONLY intended for your use regarding questions or concerns about the infusion post-treatment side-effects.  Please do not provide this number to others for use. For return to work notes please contact your primary care provider.   If someone you know is interested in receiving treatment please have them call the COVID hotline at [phone].   

## 2020-05-27 IMAGING — MR MR ANKLE*L* W/O CM
5 series · 40 of 40 positions shown · non-contrast
Comparison: None.

CLINICAL DATA: Worsening bilateral ankle pain for 10 years. No
known injury.

EXAM:
MRI OF THE LEFT ANKLE WITHOUT CONTRAST
TECHNIQUE: Multiplanar, multisequence MR imaging of the ankle was performed. No
intravenous contrast was administered.

[Series 4: T2 fat-sat · axial · 3.0mm · 0.53mm/px · z∈[-110,+29]mm · 9 of 36 slices shown (1 of 2)]
[im 1/36]
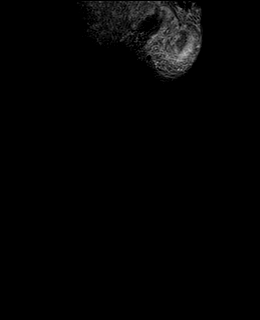
[im 5/36]
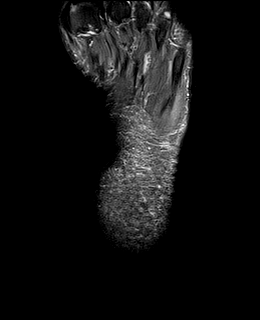
[im 9/36]
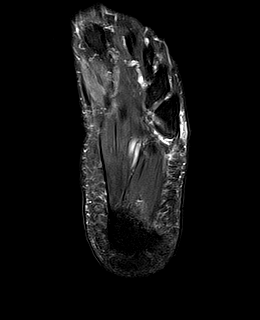
[im 14/36]
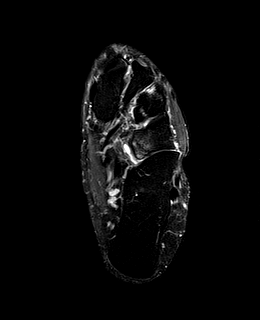
[im 18/36]
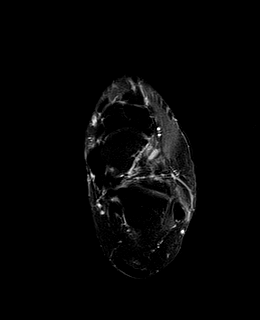
[im 22/36]
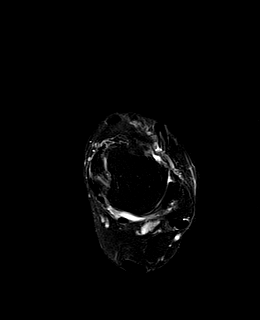
[im 27/36]
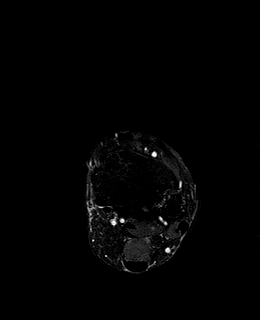
[im 31/36]
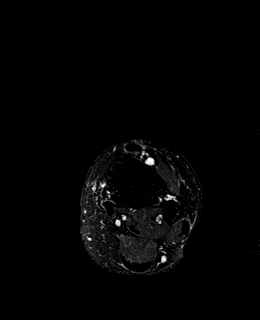
[im 36/36]
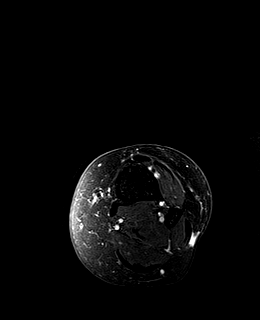

[Series 5: PD fat-sat · axial · 3.0mm · 0.47mm/px · z∈[-110,+29]mm · 10 of 36 slices shown]
[im 1/36]
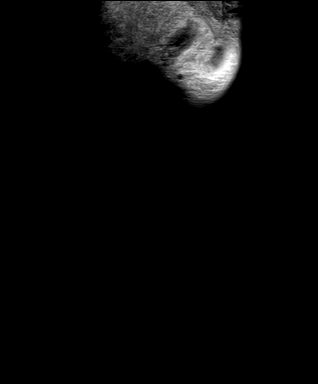
[im 4/36]
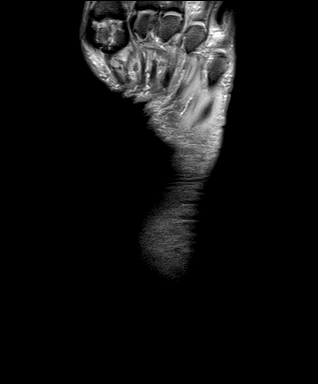
[im 8/36]
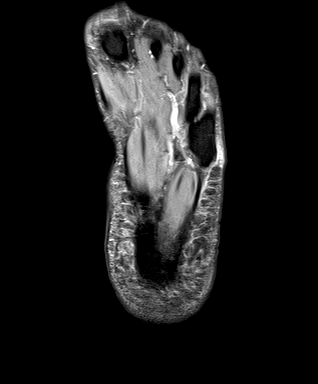
[im 12/36]
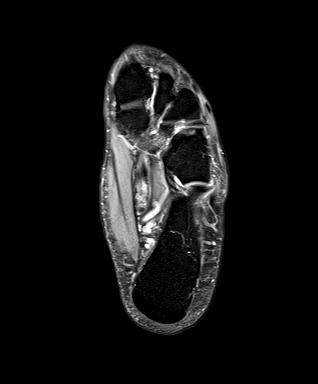
[im 16/36]
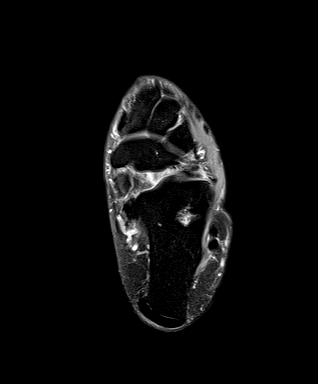
[im 20/36]
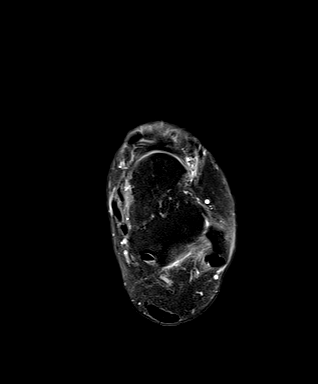
[im 24/36]
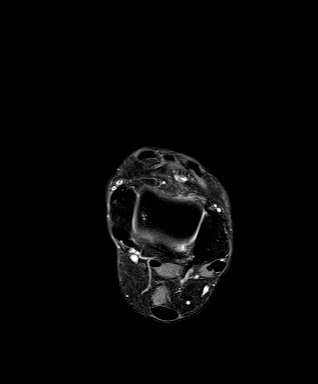
[im 28/36]
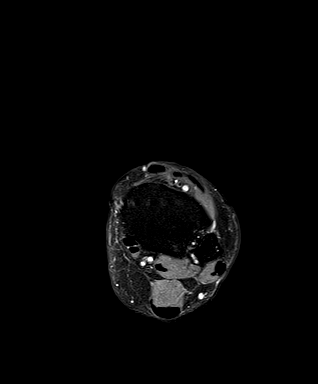
[im 32/36]
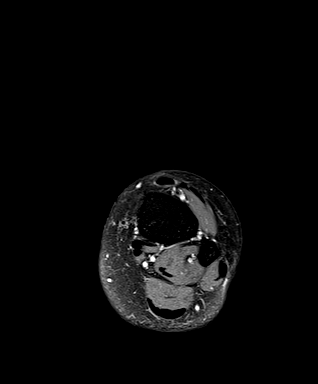
[im 36/36]
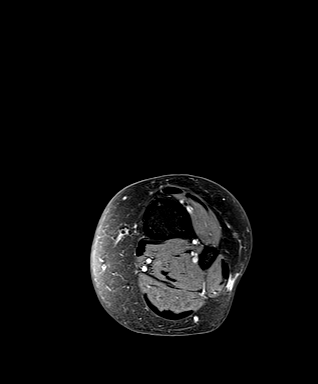

[Series 6: T1 · sagittal · 4.0mm · 0.56mm/px · 5 of 18 slices shown]
[im 1/18]
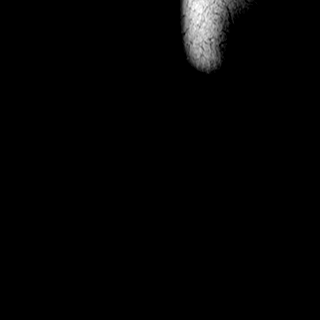
[im 5/18]
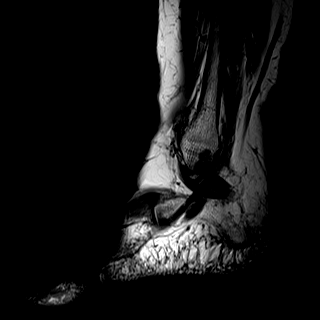
[im 9/18]
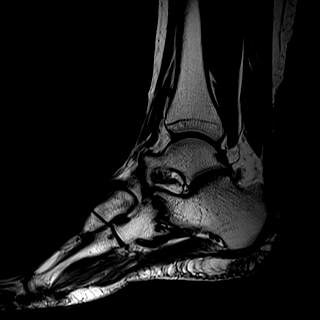
[im 13/18]
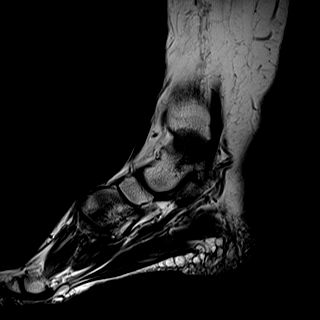
[im 18/18]
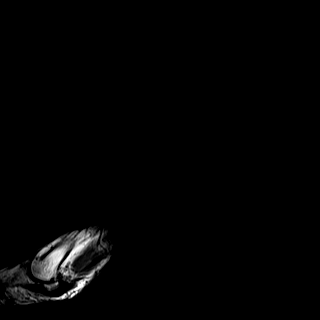

[Series 7: STIR · sagittal · 4.0mm · 0.70mm/px · 5 of 18 slices shown]
[im 1/18]
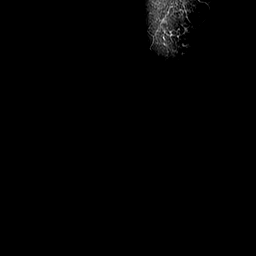
[im 5/18]
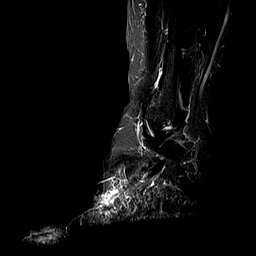
[im 9/18]
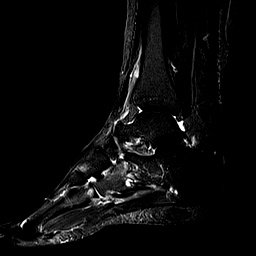
[im 13/18]
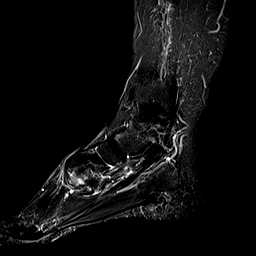
[im 18/18]
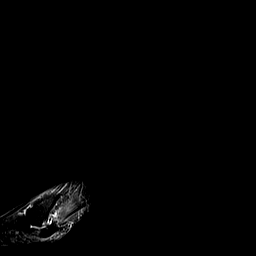

[Series 8: T2 fat-sat · coronal · 3.0mm · 0.62mm/px · 11 of 42 slices shown (2 of 2)]
[im 1/42]
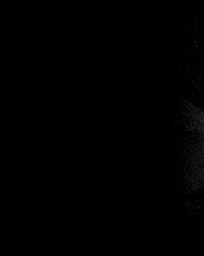
[im 5/42]
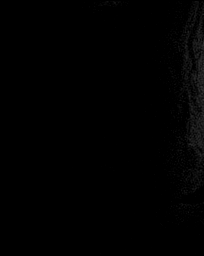
[im 9/42]
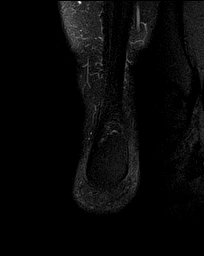
[im 13/42]
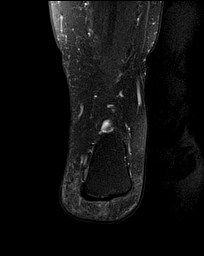
[im 17/42]
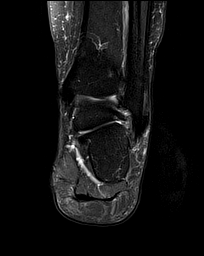
[im 21/42]
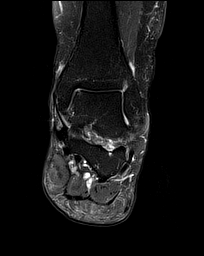
[im 25/42]
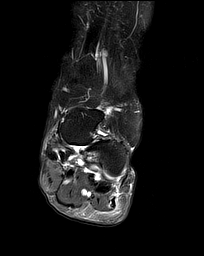
[im 29/42]
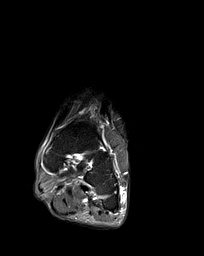
[im 33/42]
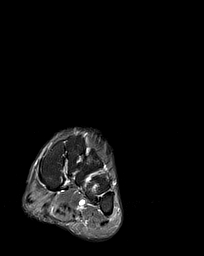
[im 37/42]
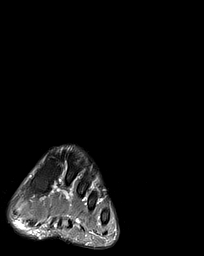
[im 42/42]
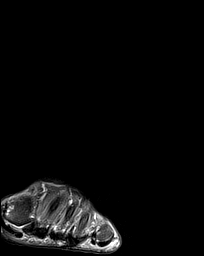

[40 of 40 positions shown; findings below may reference images not displayed]

FINDINGS: TENDONS

Peroneal: Intact.

Posteromedial: Intact.

Anterior: Intact.

Achilles: Intact.

Plantar Fascia: Normal.

LIGAMENTS

Lateral: Intact.

Medial: Intact.

CARTILAGE

Ankle Joint: Punctate osteochondral lesion medial talar dome
measures 0.1 cm in diameter. Cartilage is thinned and there is a
tiny focus of underlying subchondral edema. No evidence of fragment
instability.

Subtalar Joints/Sinus Tarsi: Normal.

Bones: No fracture or worrisome lesion. Scattered degenerative
disease of the midfoot is identified. Mild subchondral edema is seen
in the cuboid about the medial periphery at the calcaneocuboid
joint. Also seen is some subchondral edema and cyst formation about
the tarsometatarsal joints, most notable at the third and fourth.

Other: A marker is placed on the region of concern just peripheral
to the calcaneocuboid joint. No underlying fluid collection or mass.
IMPRESSION: Calcaneocuboid osteoarthritis likely accounts for pain at the region
of concern. There is also scattered tarsometatarsal osteoarthritis
most notable at the third and fourth TMT joints.

Tiny osteochondral lesion medial talar dome without evidence of
fragment instability.

Negative for tendon or ligament tear. Plantar fascia appears normal.

## 2020-09-24 NOTE — Telephone Encounter (Signed)
Error
